# Patient Record
Sex: Male | Born: 1980 | Hispanic: No | Marital: Single | Smoking: Current every day smoker
Health system: Southern US, Community
[De-identification: ages and names within clinical notes are randomized; demographics above are authoritative.]

---

## 2005-04-23 ENCOUNTER — Emergency Department (HOSPITAL_COMMUNITY): Admission: EM | Admit: 2005-04-23 | Discharge: 2005-04-23 | Payer: Self-pay | Admitting: Family Medicine

## 2005-05-10 ENCOUNTER — Emergency Department (HOSPITAL_COMMUNITY): Admission: EM | Admit: 2005-05-10 | Discharge: 2005-05-10 | Payer: Self-pay | Admitting: Family Medicine

## 2005-11-15 ENCOUNTER — Ambulatory Visit: Payer: Self-pay | Admitting: *Deleted

## 2005-11-15 ENCOUNTER — Ambulatory Visit: Payer: Self-pay | Admitting: Internal Medicine

## 2005-11-15 DIAGNOSIS — R1013 Epigastric pain: Secondary | ICD-10-CM | POA: Insufficient documentation

## 2005-11-15 DIAGNOSIS — E049 Nontoxic goiter, unspecified: Secondary | ICD-10-CM | POA: Insufficient documentation

## 2005-12-01 ENCOUNTER — Ambulatory Visit (HOSPITAL_COMMUNITY): Admission: RE | Admit: 2005-12-01 | Discharge: 2005-12-01 | Payer: Self-pay | Admitting: Internal Medicine

## 2005-12-01 ENCOUNTER — Ambulatory Visit: Payer: Self-pay | Admitting: Internal Medicine

## 2005-12-26 ENCOUNTER — Ambulatory Visit: Payer: Self-pay | Admitting: Internal Medicine

## 2006-07-12 ENCOUNTER — Ambulatory Visit: Payer: Self-pay | Admitting: Family Medicine

## 2006-07-12 DIAGNOSIS — K219 Gastro-esophageal reflux disease without esophagitis: Secondary | ICD-10-CM

## 2006-07-12 DIAGNOSIS — F411 Generalized anxiety disorder: Secondary | ICD-10-CM | POA: Insufficient documentation

## 2006-08-01 ENCOUNTER — Ambulatory Visit: Payer: Self-pay | Admitting: Family Medicine

## 2006-11-01 ENCOUNTER — Encounter (INDEPENDENT_AMBULATORY_CARE_PROVIDER_SITE_OTHER): Payer: Self-pay | Admitting: *Deleted

## 2006-11-06 ENCOUNTER — Telehealth (INDEPENDENT_AMBULATORY_CARE_PROVIDER_SITE_OTHER): Payer: Self-pay | Admitting: *Deleted

## 2006-11-09 ENCOUNTER — Emergency Department (HOSPITAL_COMMUNITY): Admission: EM | Admit: 2006-11-09 | Discharge: 2006-11-09 | Payer: Self-pay | Admitting: Emergency Medicine

## 2006-11-09 ENCOUNTER — Emergency Department (HOSPITAL_COMMUNITY): Admission: EM | Admit: 2006-11-09 | Discharge: 2006-11-10 | Payer: Self-pay | Admitting: Emergency Medicine

## 2006-11-09 ENCOUNTER — Telehealth (INDEPENDENT_AMBULATORY_CARE_PROVIDER_SITE_OTHER): Payer: Self-pay | Admitting: Family Medicine

## 2006-11-16 ENCOUNTER — Emergency Department (HOSPITAL_COMMUNITY): Admission: EM | Admit: 2006-11-16 | Discharge: 2006-11-16 | Payer: Self-pay | Admitting: General Surgery

## 2006-12-06 ENCOUNTER — Ambulatory Visit: Payer: Self-pay | Admitting: Nurse Practitioner

## 2006-12-06 DIAGNOSIS — K5289 Other specified noninfective gastroenteritis and colitis: Secondary | ICD-10-CM

## 2007-07-03 ENCOUNTER — Encounter (INDEPENDENT_AMBULATORY_CARE_PROVIDER_SITE_OTHER): Payer: Self-pay | Admitting: *Deleted

## 2007-08-29 ENCOUNTER — Ambulatory Visit: Payer: Self-pay | Admitting: Nurse Practitioner

## 2007-08-29 DIAGNOSIS — K5900 Constipation, unspecified: Secondary | ICD-10-CM | POA: Insufficient documentation

## 2007-08-29 LAB — CONVERTED CEMR LAB
ALT: 38 units/L (ref 0–53)
AST: 20 units/L (ref 0–37)
Albumin: 4.6 g/dL (ref 3.5–5.2)
Calcium: 9.6 mg/dL (ref 8.4–10.5)
Eosinophils Absolute: 0.1 10*3/uL (ref 0.0–0.7)
Eosinophils Relative: 1 % (ref 0–5)
HCT: 49.2 % (ref 39.0–52.0)
Hemoglobin: 17.7 g/dL — ABNORMAL HIGH (ref 13.0–17.0)
Lymphocytes Relative: 34 % (ref 12–46)
Lymphs Abs: 2.1 10*3/uL (ref 0.7–4.0)
MCV: 83.2 fL (ref 78.0–100.0)
Monocytes Absolute: 0.5 10*3/uL (ref 0.1–1.0)
Monocytes Relative: 9 % (ref 3–12)
Platelets: 203 10*3/uL (ref 150–400)
RBC: 5.91 M/uL — ABNORMAL HIGH (ref 4.22–5.81)
WBC: 6.1 10*3/uL (ref 4.0–10.5)

## 2007-09-27 ENCOUNTER — Encounter (INDEPENDENT_AMBULATORY_CARE_PROVIDER_SITE_OTHER): Payer: Self-pay | Admitting: Nurse Practitioner

## 2007-11-09 ENCOUNTER — Ambulatory Visit: Payer: Self-pay | Admitting: Nurse Practitioner

## 2007-11-19 ENCOUNTER — Encounter (INDEPENDENT_AMBULATORY_CARE_PROVIDER_SITE_OTHER): Payer: Self-pay | Admitting: Nurse Practitioner

## 2007-11-28 ENCOUNTER — Ambulatory Visit (HOSPITAL_COMMUNITY): Admission: RE | Admit: 2007-11-28 | Discharge: 2007-11-28 | Payer: Self-pay

## 2007-11-28 ENCOUNTER — Encounter (INDEPENDENT_AMBULATORY_CARE_PROVIDER_SITE_OTHER): Payer: Self-pay | Admitting: Nurse Practitioner

## 2008-05-08 ENCOUNTER — Ambulatory Visit: Payer: Self-pay | Admitting: Nurse Practitioner

## 2008-08-27 ENCOUNTER — Ambulatory Visit: Payer: Self-pay | Admitting: Nurse Practitioner

## 2008-08-27 LAB — CONVERTED CEMR LAB
ALT: 29 units/L (ref 0–53)
BUN: 8 mg/dL (ref 6–23)
Basophils Relative: 1 % (ref 0–1)
CO2: 28 meq/L (ref 19–32)
Cholesterol: 187 mg/dL (ref 0–200)
Creatinine, Ser: 0.99 mg/dL (ref 0.40–1.50)
Eosinophils Absolute: 0.2 10*3/uL (ref 0.0–0.7)
HDL: 40 mg/dL (ref >39)
MCHC: 35.1 g/dL (ref 30.0–36.0)
MCV: 84.4 fL (ref 78.0–100.0)
Monocytes Relative: 10 % (ref 3–12)
Neutrophils Relative %: 51 % (ref 43–77)
Platelets: 206 10*3/uL (ref 150–400)
RBC: 5.91 M/uL — ABNORMAL HIGH (ref 4.22–5.81)
Total Bilirubin: 0.7 mg/dL (ref 0.3–1.2)
Total CHOL/HDL Ratio: 4.7
Triglycerides: 151 mg/dL — ABNORMAL HIGH (ref <150)
VLDL: 30 mg/dL (ref 0–40)

## 2008-08-28 ENCOUNTER — Encounter (INDEPENDENT_AMBULATORY_CARE_PROVIDER_SITE_OTHER): Payer: Self-pay | Admitting: Nurse Practitioner

## 2008-10-30 ENCOUNTER — Ambulatory Visit: Payer: Self-pay | Admitting: Nurse Practitioner

## 2008-10-30 DIAGNOSIS — S335XXA Sprain of ligaments of lumbar spine, initial encounter: Secondary | ICD-10-CM

## 2008-12-15 ENCOUNTER — Ambulatory Visit: Payer: Self-pay | Admitting: Nurse Practitioner

## 2009-01-27 ENCOUNTER — Encounter (INDEPENDENT_AMBULATORY_CARE_PROVIDER_SITE_OTHER): Payer: Self-pay | Admitting: Nurse Practitioner

## 2009-04-29 ENCOUNTER — Ambulatory Visit: Payer: Self-pay | Admitting: Nurse Practitioner

## 2009-09-29 ENCOUNTER — Ambulatory Visit: Payer: Self-pay | Admitting: Internal Medicine

## 2009-09-29 DIAGNOSIS — R10814 Left lower quadrant abdominal tenderness: Secondary | ICD-10-CM

## 2009-10-13 LAB — CONVERTED CEMR LAB
ALT: 34 units/L (ref 0–53)
Alkaline Phosphatase: 63 units/L (ref 39–117)
Basophils Absolute: 0.1 10*3/uL (ref 0.0–0.1)
Creatinine, Ser: 0.89 mg/dL (ref 0.40–1.50)
Eosinophils Absolute: 0.2 10*3/uL (ref 0.0–0.7)
Eosinophils Relative: 3 % (ref 0–5)
HCT: 52.2 % — ABNORMAL HIGH (ref 39.0–52.0)
MCHC: 33 g/dL (ref 30.0–36.0)
MCV: 86.4 fL (ref 78.0–100.0)
Monocytes Absolute: 0.7 10*3/uL (ref 0.1–1.0)
Platelets: 229 10*3/uL (ref 150–400)
RDW: 13.7 % (ref 11.5–15.5)
Sodium: 138 meq/L (ref 135–145)
Total Bilirubin: 0.4 mg/dL (ref 0.3–1.2)
Total Protein: 7.3 g/dL (ref 6.0–8.3)

## 2009-10-21 ENCOUNTER — Encounter (INDEPENDENT_AMBULATORY_CARE_PROVIDER_SITE_OTHER): Payer: Self-pay | Admitting: Nurse Practitioner

## 2009-10-26 ENCOUNTER — Ambulatory Visit: Payer: Self-pay | Admitting: Nurse Practitioner

## 2009-10-26 LAB — CONVERTED CEMR LAB
AST: 24 units/L (ref 0–37)
Albumin: 4.8 g/dL (ref 3.5–5.2)
Alkaline Phosphatase: 56 units/L (ref 39–117)
BUN: 14 mg/dL (ref 6–23)
Basophils Relative: 1 % (ref 0–1)
Eosinophils Absolute: 0.2 10*3/uL (ref 0.0–0.7)
Eosinophils Relative: 3 % (ref 0–5)
Glucose, Bld: 89 mg/dL (ref 70–99)
HCT: 49.3 % (ref 39.0–52.0)
MCHC: 34.1 g/dL (ref 30.0–36.0)
MCV: 85.4 fL (ref 78.0–100.0)
Monocytes Relative: 9 % (ref 3–12)
Neutrophils Relative %: 54 % (ref 43–77)
Platelets: 246 10*3/uL (ref 150–400)
Potassium: 4.4 meq/L (ref 3.5–5.3)
RBC: 5.77 M/uL (ref 4.22–5.81)
Total Bilirubin: 0.6 mg/dL (ref 0.3–1.2)

## 2009-10-27 ENCOUNTER — Encounter (INDEPENDENT_AMBULATORY_CARE_PROVIDER_SITE_OTHER): Payer: Self-pay | Admitting: Nurse Practitioner

## 2009-11-02 ENCOUNTER — Ambulatory Visit: Payer: Self-pay | Admitting: Nurse Practitioner

## 2009-11-04 ENCOUNTER — Ambulatory Visit (HOSPITAL_COMMUNITY): Admission: RE | Admit: 2009-11-04 | Discharge: 2009-11-04 | Payer: Self-pay | Admitting: Internal Medicine

## 2009-11-09 ENCOUNTER — Ambulatory Visit: Payer: Self-pay | Admitting: Internal Medicine

## 2009-12-15 ENCOUNTER — Emergency Department (HOSPITAL_COMMUNITY)
Admission: EM | Admit: 2009-12-15 | Discharge: 2009-12-15 | Payer: Self-pay | Source: Home / Self Care | Admitting: Emergency Medicine

## 2010-01-12 ENCOUNTER — Ambulatory Visit: Payer: Self-pay | Admitting: Nurse Practitioner

## 2010-01-12 ENCOUNTER — Telehealth (INDEPENDENT_AMBULATORY_CARE_PROVIDER_SITE_OTHER): Payer: Self-pay | Admitting: Nurse Practitioner

## 2010-02-04 ENCOUNTER — Encounter (INDEPENDENT_AMBULATORY_CARE_PROVIDER_SITE_OTHER): Payer: Self-pay | Admitting: *Deleted

## 2010-02-23 ENCOUNTER — Encounter (INDEPENDENT_AMBULATORY_CARE_PROVIDER_SITE_OTHER): Payer: Self-pay | Admitting: Nurse Practitioner

## 2010-03-06 ENCOUNTER — Encounter: Payer: Self-pay | Admitting: Internal Medicine

## 2010-03-09 ENCOUNTER — Ambulatory Visit
Admission: RE | Admit: 2010-03-09 | Discharge: 2010-03-09 | Payer: Self-pay | Source: Home / Self Care | Attending: Nurse Practitioner | Admitting: Nurse Practitioner

## 2010-03-09 DIAGNOSIS — M2550 Pain in unspecified joint: Secondary | ICD-10-CM | POA: Insufficient documentation

## 2010-03-16 NOTE — Assessment & Plan Note (Signed)
Summary: Constipation   Vital Signs:  Patient profile:   30 year old male Height:      72 inches Weight:      185 pounds Temp:     98.1 degrees F oral Pulse rate:   80 / minute Pulse rhythm:   regular Resp:     18 per minute BP sitting:   121 / 79  (left arm) Cuff size:   regular  Vitals Entered By: [place] Shannon (April 29, 2009 8:53 AM) CC: pt is here for stomach problem...Marland KitchenMarland Kitchen pt says the problem is pain above his bell button.... pt says  he used to have the pain in the bottom of his stomach now its on top..., Abdominal Pain Is Patient Diabetic? No Pain Assessment Patient in pain? no       Does patient need assistance? Functional Status Self care Ambulation Normal   CC:  pt is here for stomach problem...Marland KitchenMarland Kitchen pt says the problem is pain above his bell button.... pt says  he used to have the pain in the bottom of his stomach now its on top... and Abdominal Pain.  History of Present Illness:  Pt into the office with c/o stomach pain, more specifically gas. Reports that current symptoms are different that when he previously presented to this office, though it is very difficult to get history from the pt.   Pt has been seen in this office several times with stomach complaints.  Last visit 4 months ago. States that he wakes up with abdominal pain. EGD done previously which was normal.  Pt has concerns that he has cancer.  no family history of Gastric CA.    + midepigastric tenderness +gas +constipation (very small BM every other day) He has changed his diet and has stopped eating spicy foods, tomato foods, onions. No longer drinking tea or juice.  He is drinking plenty of water. Weight gain of 8 pounds since his last visit.  Pt is still using the miralax and nexium as ordered.  Dyspepsia History:      He has no alarm features of dyspepsia including no history of melena, hematochezia, dysphagia, persistent vomiting, or involuntary weight loss > 5%.  There is a prior history of  GERD.  The patient does not have a prior history of documented ulcer disease.  An H-2 blocker medication is not currently being taken.  A prior EGD has been done which showed no evidence for moderate or severe esophagitis or Barrett's esophagus.     Current Medications (verified): 1)  Tramadol Hcl 50 Mg Tabs (Tramadol Hcl) .... One Tablet By Mouth Daily As Needed For Pain 2)  Miralax   Powd (Polyethylene Glycol 3350) .Marland Kitchen.. 1 Tablespoon Mixed With 8oz Glass of Water or Juice 3)  Hydrocortisone 1 %  Crea (Hydrocortisone) .Marland Kitchen.. 1 Application To Neck Daily As Needed 4)  Nexium 40 Mg Cpdr (Esomeprazole Magnesium) .Marland Kitchen.. 1 Tablet By Mouth Daily For Stomach  Allergies (verified): No Known Drug Allergies  Review of Systems General:  Denies fever. CV:  Denies chest pain or discomfort. Resp:  Denies cough. GI:  Complains of abdominal pain and constipation; denies indigestion, nausea, and vomiting.  Physical Exam  General:  alert.   Head:  normocephalic.   Lungs:  normal breath sounds.   Heart:  normal rate and regular rhythm.   Abdomen:  BS x 4 LLQ tenderness most tender to palpation with some tenderness in LUQ and RUQ Msk:  up to the exam table Neurologic:  alert &  oriented X3.     Impression & Recommendations:  Problem # 1:  CONSTIPATION (ICD-564.00) advised pt to continue taking miralax daily Needs to add more fiber foods to his diet - reviewed with pt what foods he should eat His updated medication list for this problem includes:    Miralax Powd (Polyethylene glycol 3350) .Marland Kitchen... 1 tablespoon mixed with 8oz glass of water or juice  Problem # 2:  GERD (ICD-530.81) continue current meds His updated medication list for this problem includes:    Nexium 40 Mg Cpdr (Esomeprazole magnesium) .Marland Kitchen... 1 tablet by mouth daily for stomach  Complete Medication List: 1)  Tramadol Hcl 50 Mg Tabs (Tramadol hcl) .... One tablet by mouth daily as needed for pain 2)  Miralax Powd (Polyethylene glycol  3350) .Marland Kitchen.. 1 tablespoon mixed with 8oz glass of water or juice 3)  Hydrocortisone 1 % Crea (Hydrocortisone) .Marland Kitchen.. 1 application to neck daily as needed 4)  Nexium 40 Mg Cpdr (Esomeprazole magnesium) .Marland Kitchen.. 1 tablet by mouth daily for stomach  Dyspepsia Assessment/Plan:  Step Therapy: GERD Treatment Protocols:    Step-1: started  Patient Instructions: 1)  You have constipation.   2)  You need to keep taking the miralax daily. 3)  Add more foods with fiber to your diet. 4)  Get magnesium sulfate from pharmacy - drink 1/2 bottle tonight when you get home then drink the other 1/2 of the bottle tomorrow 5)  Continue to take your nexium (purple pill) in the morning. 6)  Follow up as needed

## 2010-03-16 NOTE — Letter (Signed)
Summary: *HSN Results Follow up  [facility]  [address] [place], [place] [address]   Phone: [phone]  Fax: [phone]      10/27/2009   RAYFIELD BEEM [address] [place], [place]  [address]   Dear  Mr. LATRON RIBAS,                            ____S.Drinkard,FNP   ____D. Gore,FNP       ____B. McPherson,MD   ____V. Rankins,MD    ____E. Mulberry,MD    _X__N. Daphine Deutscher, FNP  ____D. Reche Dixon, MD    ____K. Philipp Deputy, MD    ____Other     This letter is to inform you that your recent test(s):  _______Pap Smear    ___X____Lab Test     _______X-ray    ___X___ is within acceptable limits  _______ requires a medication change  _______ requires a follow-up lab visit  _______ requires a follow-up visit with your provider   Comments: Labs done during recent office visit are normal.       _________________________________________________________ If you have any questions, please contact our office [phone].                    Sincerely,    Lehman Prom FNP [facility]

## 2010-03-16 NOTE — Assessment & Plan Note (Signed)
Summary: MARTIN FNP/ STOMACH PROBLEMS//GK   Vital Signs:  Patient profile:   30 year old male Weight:      181.8 pounds BMI:     24.75 Temp:     97.5 degrees F Pulse rate:   69 / minute Pulse rhythm:   regular Resp:     18 per minute BP sitting:   120 / 80  (left arm) Cuff size:   regular  Vitals Entered By: Vesta Mixer CMA (September 29, 2009 11:24 AM) CC: Recurrent stomach pain.  Not taking meds because his card expired. Is Patient Diabetic? No  Does patient need assistance? Ambulation Normal   CC:  Recurrent stomach pain.  Not taking meds because his card expired.Marland Kitchen  History of Present Illness: 1.  Abdominal pain:  Hx of chronic complaints.  Used to have bilateral lower abdominal pain.  States he went to GI specialist  for this and underwent EGD  in 11/2007 with Dr. Corinda Gubler that was normal.  Pt. states the lower abdominal pain resolved eventually and then developed epigastric pain.  Looking through his chart, it appears he has been on at least a couple of antispasmodics--Levsin and Bentyl.  Pt. states these have not helped. Pt. also describes constipation on a regular basis.  Cannot clarify if Miralax helps.  Sometimes, however, can have explosive loose stool--maybe once a week.  Most of time dealing with bloating and constipation.  Generally feels better when has to fast for religious holidays, but then when eats, feels he needs to rush to the bathroom only to be unable to pass stool.  No melena or hematochezia.   Describes having something hot coming up his chest and into his throat. No family history of colon cancer.   [place]  States his symptoms really seemed to start in 2007 when he started eating fast foods.  Pt. states he is no longer eating that way. Pt.  later gives history of a friend of his who had similar problems with stomach pain and ultimately diagnosed with gastric cancer and died--previous pt. here.  Discussed he already had a normal EGD and that  his symptoms of epigastric pain preceded the study.  Allergies (verified): No Known Drug Allergies  Family History: Father, 60:  Healthy Mother, 28s:  Healthy 4 Brothers, 2 Sisters:  all younger and all healthy No children   Social History: Lives at home with 2 roommates. 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 Moved to [place] in [place] from [place].   Came as a refugee. Bettina Gavia is in [place].    Physical Exam  General:  Anxious appearing male, NAD otherwise Lungs:  Normal respiratory effort, chest expands symmetrically. Lungs are clear to auscultation, no crackles or wheezes. Heart:  Normal rate and regular rhythm. S1 and S2 normal without gallop, murmur, click, rub or other extra sounds. Abdomen:  Soft, mild LLQ tenderness without peritoneal or rebound.  LLQ with what feels like palpable stool.  No HSM or other  mass. Rectal:  No external abnormalities noted. Normal sphincter tone. No rectal masses or tenderness.  Heme negative brown stool.   Impression & Recommendations:  Problem # 1:  ABDOMINAL TENDERNESS, LEFT LOWER QUADRANT (ICD-789.64) Suspect secondary to constipation Orders: T-Comprehensive Metabolic Panel (04540-98119) T-CBC w/Diff ([id number])  Problem # 2:  CONSTIPATION (ICD-564.00) Needs to follow N. Martin's directions regarding Miralax and high fiber diet. 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 1 tablet by mouth daily for stomach  Orders: T-Comprehensive Metabolic Panel ([id number]) T-CBC w/Diff ([id number])  Problem # 4:  ANXIETY (ICD-300.00) Offered counseling  with [facility] for treatment.   Pt. not interested.  Complete Medication List: 1)  Tramadol Hcl 50 Mg Tabs (Tramadol hcl) .... One tablet by mouth daily as needed for pain 2)  Miralax Powd (Polyethylene glycol 3350) .Marland KitchenMarland KitchenMarland Kitchen 17 g powder mixed with 8 oz water 3)  Hydrocortisone 1 % Crea (Hydrocortisone) .Marland Kitchen.. 1 application to neck daily as needed 4)  Nexium 40 Mg Cpdr (Esomeprazole magnesium) .Marland Kitchen.. 1 tablet by mouth daily for stomach  Patient Instructions: 1)  You need to take your meds regularly--do not stop them unless you are having a side effect. 2)  You need to follow up in 1 month to make sure the tenderness in your left abdomen is gone when you have normal soft bowel movements. 3)  Eat lots of fruits and vegetables. 4)  Bring in stool cards in 2 weeks completed 5)  Follow up with Jesse Fall in 1 month to recheck Left lower abdomen and make sure nontender and soft. Prescriptions: NEXIUM 40 MG CPDR (ESOMEPRAZOLE MAGNESIUM) 1 tablet by mouth daily for stomach  #30 x 6   Entered and Authorized by:   Julieanne Manson MD   Signed by:   Julieanne Manson MD on 09/29/2009   Method used:   Faxed to ...       [facility] (retail)       [address] [place], [place]  [address]       Ph: [phone]       Fax: [phone]   RxID:   [id number] MIRALAX   POWD (POLYETHYLENE GLYCOL 3350) 17 g powder mixed with 8 oz water  #1 month x 11   Entered and Authorized by:   Julieanne Manson MD   Signed by:   Julieanne Manson MD on 09/29/2009   Method used:   Faxed to ...       [facility] (retail)       [address] [place], [place]  [address]       Ph: [phone]       Fax: [phone]   RxID:   [id number]   Appended Document: Daphine Deutscher FNP/ STOMACH PROBLEMS//GK  Laboratory Results    Stool - Occult Blood Hemmoccult #1: negative Date: 10/21/2009 Hemoccult #2: negative Date:  10/21/2009 Hemoccult #3: negative Date: 10/21/2009

## 2010-03-16 NOTE — Assessment & Plan Note (Signed)
Summary: F/u U/S results   Vital Signs:  Patient profile:   30 year old male Weight:      181.0 pounds Temp:     97.4 degrees F oral Pulse rate:   68 / minute Pulse rhythm:   regular Resp:     20 per minute BP sitting:   128 / 80  (left arm) Cuff size:   regular  Vitals Entered By: Levon Hedger (November 09, 2009 11:21 AM) CC: follow-up visit u/s results...still having the same issue, Abdominal Pain Is Patient Diabetic? No Pain Assessment Patient in pain? no       Does patient need assistance? Functional Status Self care Ambulation Normal Comments pt brought medication today   CC:  follow-up visit u/s results...still having the same issue and Abdominal Pain.  History of Present Illness:  Pt into the office for ultrasound results.  Friend n room with pt today He presents today with all his medications. (nexium and miralax) Pt seems confused by the fact that he taking nexium as dispensed by the [place] pharmacy and by the nexium that [facility] PAP program. Pt still has questions about his diet and if he still need to avoid the foods previously discussed such as onions, tomatos, fried foods, ETOH.  Dyspepsia History:      There is a prior history of GERD.  The patient does not have a prior history of documented ulcer disease.  The dominant symptom is heartburn or acid reflux.  An H-2 blocker medication is not currently being taken.  A prior EGD has been done which showed no evidence for moderate or severe esophagitis or Barrett's esophagus.     Habits & Providers  Alcohol-Tobacco-Diet     Alcohol drinks/day: 0     Tobacco Status: never  Exercise-Depression-Behavior     Does Patient Exercise: yes     Exercise Counseling: to improve exercise regimen     Type of exercise: soccer     Times/week: 1  Comments: SOB when doing most anything physical such as climbing steps.  no exercise other than soccer on sundays.  No SOB with Normal activities such as walking.   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 CV:  Denies chest pain or discomfort. Resp:  Complains of shortness of breath; denies cough; Usually when climbing the steps to deliver, also during the soccer games.  He used to be very athletic but now only plays soccer on Sundays.Marland Kitchen GI:  Complains of indigestion; denies constipation. Neuro:  Complains of headaches and tingling; left hand tingling. Psych:  Complains of anxiety; friend present with today admits that pt is anxious.  Physical Exam  General:  alert.   Head:  normocephalic.   Lungs:  normal breath sounds.   Heart:  normal rate and regular rhythm.   Msk:  up to the exam table Neurologic:  alert & oriented X3.   Psych:  Oriented X3.     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 symptoms are controlled now but that does not ok him to return back to his normal diet His updated medication list for this problem includes:  Nexium 40 Mg Cpdr (Esomeprazole magnesium) .Marland Kitchen... 1 tablet by mouth daily for stomach  Complete Medication List: 1)  Tramadol Hcl 50 Mg Tabs (Tramadol hcl) .... One tablet by mouth daily as needed for pain 2)  Miralax Powd (Polyethylene glycol 3350) .Marland KitchenMarland KitchenMarland Kitchen 17 g powder mixed with 8 oz water 3)  Hydrocortisone 1 % Crea (Hydrocortisone) .Marland Kitchen.. 1 application to neck daily as needed 4)  Nexium 40 Mg Cpdr (Esomeprazole magnesium) .Marland Kitchen.. 1 tablet by mouth daily for stomach  Dyspepsia Assessment/Plan:  Step Therapy: GERD Treatment Protocols:    Step-1: started  Patient Instructions: 1)  Follow up as needed.

## 2010-03-16 NOTE — Letter (Signed)
Summary: TEST ORDER FORM//ULTRASOUND /APPT DATE & TIME  TEST ORDER FORM//ULTRASOUND /APPT DATE & TIME   Imported By: Arta Bruce 11/06/2009 15:10:16  _____________________________________________________________________  External Attachment:    Type:   Image     Comment:   External Document

## 2010-03-16 NOTE — Letter (Signed)
Summary: Handout Printed  Printed Handout:  - Constipation-Brief 

## 2010-03-16 NOTE — Progress Notes (Signed)
Summary: GI referral  Phone Note Outgoing Call   Summary of Call: refer to GI reason: ongoing stomach issues Referral in your basket Initial call taken by: Lehman Prom FNP,  January 12, 2010 9:28 AM  Follow-up for Phone Call        SEND REFERRALL TO [facility] WAITING FOR AN APPT  Follow-up by: Cheryll Dessert,  January 12, 2010 10:07 AM

## 2010-03-16 NOTE — Assessment & Plan Note (Signed)
Summary: Thyromegly   Vital Signs:  Patient profile:   30 year old male Weight:      178.1 pounds Temp:     97.5 degrees F oral Pulse rate:   80 / minute Pulse rhythm:   regular Resp:     20 per minute BP sitting:   130 / 100  (left arm) Cuff size:   regular  Vitals Entered By: Levon Hedger (November 02, 2009 11:25 AM) CC: 1 month f/u...redness in eyes, Abdominal Pain Is Patient Diabetic? No Pain Assessment Patient in pain? no       Does patient need assistance? Functional Status Self care Ambulation Normal Comments pt states he is not taking medication he says it gives him alot of problem   CC:  1 month f/u...redness in eyes and Abdominal Pain.  History of Present Illness:  Pt into the office with f/u on abdominal pain. Pt reports that he is taking miralax daily as ordered. Pt has also started taking nexium 40mg  by mouth 1 hour before food.  Pt is taking nexium and is tolerating that well.  He reports that he was also to take a medication daily before breakfast but he had some side effects.  Pt does not know the name of the medications.  Reports it was started after this last visit and so he stopped that medication.  Abdominal symptoms have seemed to improve at time  PT DID NOT BRING HIS MEDICATIONS TODAY IN THE OFFICE - Pt has been advised to bring all the medications to each visit  Dyspepsia History:      He has no alarm features of dyspepsia including no history of melena, hematochezia, dysphagia, persistent vomiting, or involuntary weight loss > 5%.  There is a prior history of GERD.  The patient does not have a prior history of documented ulcer disease.  The dominant symptom is heartburn or acid reflux.  An H-2 blocker medication is not currently being taken.  A prior EGD has been done which showed no evidence for moderate or severe esophagitis or Barrett's esophagus.     Habits & Providers  Alcohol-Tobacco-Diet     Alcohol drinks/day: 0     Tobacco Status:  never  Exercise-Depression-Behavior     Does Patient Exercise: yes     Exercise Counseling: to improve exercise regimen     Type of exercise: soccer     Times/week: 1  Allergies (verified): No Known Drug Allergies  Review of Systems General:  Complains of fatigue. Eyes:  Complains of eye irritation and red eye. CV:  Complains of fatigue and palpitations. Resp:  Denies cough. GI:  Complains of abdominal pain; denies nausea and vomiting; ongoing abd pain. Neuro:  Complains of numbness and tingling; Left hand numbness.  Physical Exam  General:  alert.   Head:  normocephalic.   Eyes:  ? exopthalmus scerla red Neck:  right sided thyroid enlargement Lungs:  normal breath sounds.   Heart:  normal rate and regular rhythm.   Abdomen:  normal bowel sounds.   no abdominal tenderness  Msk:  up to the exam table Neurologic:  alert & oriented X3.     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 1 tablet by  mouth daily for stomach  Complete Medication List: 1)  Tramadol Hcl 50 Mg Tabs (Tramadol hcl) .... One tablet by mouth daily as needed for pain 2)  Miralax Powd (Polyethylene glycol 3350) .Marland KitchenMarland KitchenMarland Kitchen 17 g powder mixed with 8 oz water 3)  Hydrocortisone 1 % Crea (Hydrocortisone) .Marland Kitchen.. 1 application to neck daily as needed 4)  Nexium 40 Mg Cpdr (Esomeprazole magnesium) .Marland Kitchen.. 1 tablet by mouth daily for stomach  Dyspepsia Assessment/Plan:  Step Therapy: GERD Treatment Protocols:    Step-1: started  Patient Instructions: 1)  Get thyroid ultrasound as ordered. 2)  Thyroid problems may be the cause of fatigue, dizziness, eye problems that you are experiencing. 3)  Follow up  in this office 1 week after the ultrasound for results and thyroid lab results. 4)  Keep taking your nexium and miralax (powder medication) as ordered

## 2010-03-16 NOTE — Letter (Signed)
Summary: *HSN Results Follow up  [facility]  [address] [place], [place] [address]   Phone: [phone]  Fax: [phone]      10/21/2009   Alexander Soto [address] [place], [place]  [address]   Dear  [name],                            ____S.Drinkard,FNP   ____D. Gore,FNP       ____B. McPherson,MD   ____V. Rankins,MD    ____E. Mulberry,MD    _X___N. Daphine Deutscher, FNP  ____D. Reche Dixon, MD    ____K. Philipp Deputy, MD    ____Other     This letter is to inform you that your recent test(s):  Stool Cards    ___X____ is within acceptable limits  _______ requires a medication change  _______ requires a follow-up lab visit  _______ requires a follow-up visit with your provider   Comments: Stool cards x 3 normal.       _________________________________________________________ If you have any questions, please contact our office [phone].                    Sincerely,    Lehman Prom FNP [facility]

## 2010-03-16 NOTE — Assessment & Plan Note (Signed)
Summary: Abdominal problems   Vital Signs:  Patient profile:   30 year old male Height:      72 inches Weight:      180 pounds Temp:     97.8 degrees F oral Pulse rate:   72 / minute Pulse rhythm:   regular Resp:     18 per minute BP sitting:   100 / 78  (left arm) Cuff size:   regular  Vitals Entered By: Armenia Shannon (January 12, 2010 8:56 AM) CC: stomach pains.., Abdominal Pain Is Patient Diabetic? No Pain Assessment Patient in pain? no       Does patient need assistance? Functional Status Self care Ambulation Normal   CC:  stomach pains.Marland Kitchen and Abdominal Pain.  History of Present Illness:  Pt into the office with continued complaints of abdominal pain. Pt has been into this office several times for multiple complaints. Pt has has had workup for both cardiac and GI symptoms which were all normal. Pt reports that he has been to the Urgent Care since his last visit here. "My heart is good"  Pt reports that he had an x-ray done Symptoms are present mostly in the morning Also when he eats different foods - this provider has been over and over with pt regarding the foods to avoid.  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  I have advised not to eat food from that office  Dyspepsia History:      The patient has positive alarm features of dyspepsia which include history of anemia.  There is a prior history of GERD.  The patient does not have a prior history of documented ulcer disease.  The dominant symptom is heartburn or acid reflux.  An H-2 blocker medication is not currently being taken.  He has no history of a positive H. Pylori serology.  A prior EGD has been done which showed no evidence for moderate or severe esophagitis or Barrett's esophagus.     Allergies: No Known Drug Allergies  Review of Systems General:  Denies fever. CV:  Denies chest pain or discomfort. Resp:  Denies cough. GI:  Complains of abdominal pain and gas; denies  constipation, nausea, and vomiting; +intermittent.  Physical Exam  General:  alert.   Head:  normocephalic.   Lungs:  normal breath sounds.   Heart:  normal rate and regular rhythm.   Abdomen:  normal bowel sounds.   Msk:  up to the exam table Neurologic:  alert & oriented X3.   Skin:  color normal.   Psych:  Oriented X3.     Impression & Recommendations:  Problem # 1:  GERD (ICD-530.81) ongoing problem Pt reports that he is taking nexium as ordered he has also been advised to monitor his diet His updated medication list for this problem includes:    Nexium 40 Mg Cpdr (Esomeprazole magnesium) .Marland Kitchen... 1 tablet by mouth daily for stomach  Orders: Gastroenterology Referral (GI)  Problem # 2:  NEED PROPHYLACTIC VACCINATION&INOCULATION FLU (ICD-V04.81) given today in office  Problem # 3:  ANXIETY (ICD-300.00) spoke with pt advised him that mood as some to do with how his body feels.   If he is overly anxious then he needs to start meds, pt has been reluctant in the past  Complete Medication List: 1)  Tramadol Hcl 50 Mg Tabs (Tramadol hcl) .... One tablet by mouth daily as needed for pain 2)  Miralax Powd (Polyethylene glycol 3350) .Marland KitchenMarland KitchenMarland Kitchen 17 g powder mixed  with 8 oz water 3)  Hydrocortisone 1 % Crea (Hydrocortisone) .Marland Kitchen.. 1 application to neck daily as needed 4)  Nexium 40 Mg Cpdr (Esomeprazole magnesium) .Marland Kitchen.. 1 tablet by mouth daily for stomach  Other Orders: Flu Vaccine 41yrs + (31540) Admin 1st Vaccine (08676)  Dyspepsia Assessment/Plan:  Step Therapy: GERD Treatment Protocols:    Step-1: started  Patient Instructions: 1)  You  have been given the flu vaccine today 2)  You will be referred to the Stomach specialist and you will be notified of the time/date of the appointment. 3)  Follow up in this office as needed   Orders Added: 1)  Flu Vaccine 45yrs + [90658] 2)  Admin 1st Vaccine [90471] 3)  Est. Patient Level III [19509] 4)  Gastroenterology Referral  [GI]   Immunizations Administered:  Influenza Vaccine # 1:    Vaccine Type: Fluvax 3+    Site: right deltoid    Mfr: GlaxoSmithKline    Dose: 0.5 ml    Route: IM    Given by: Armenia Shannon    Exp. Date: 08/14/2010    Lot #: [id number]    VIS given: 09/08/09 version given January 12, 2010.  Flu Vaccine Consent Questions:    Do you have a history of severe allergic reactions to this vaccine? no    Any prior history of allergic reactions to egg and/or gelatin? no    Do you have a sensitivity to the preservative Thimersol? no    Do you have a past history of Guillan-Barre Syndrome? no    Do you currently have an acute febrile illness? no    Have you ever had a severe reaction to latex? no    Vaccine information given and explained to patient? yes   Immunizations Administered:  Influenza Vaccine # 1:    Vaccine Type: Fluvax 3+    Site: right deltoid    Mfr: GlaxoSmithKline    Dose: 0.5 ml    Route: IM    Given by: Armenia Shannon    Exp. Date: 08/14/2010    Lot #: [id number]    VIS given: 09/08/09 version given January 12, 2010.

## 2010-03-18 NOTE — Letter (Signed)
Summary: *HSN Results Follow up  [facility]  [address] [place], [place] [address]   Phone: [phone]  Fax: [phone]      02/04/2010   Alexander Soto Facer [address] [place], [place]  [address]   Dear  Mr. [name], WE HAVE BEEN TRYING TO REACH YOU ABOUT YOUR GI REFERRAL PLEASE, CALL  GI PH # [phone] TO SCHEDULE AN APPT AND THE ADDRESS IS  [address] . THANK YOU FOR YOU ATTENTION AND HAVE A HAPPY & SAFETY HOLIDAYS   If you have any questions, please contact our office                     Sincerely,  Cheryll Dessert [facility]

## 2010-03-18 NOTE — Letter (Signed)
Summary: Referral - not able to see patient  [facility]  [address] [place], [place] [address]   Phone: [phone]  Fax: [phone]    February 23, 2010   Lehman Prom, FNP [address] [place], [place] [address]   Re:   Alexander Soto DOB:  11-Feb-1981 MRN:   [id number]    Dear Dr. Daphine Deutscher:  Thank you for your kind referral of the above patient.  We have attempted to schedule the recommended procedure Consultation regarding abdominal pain, but have not been able to schedule because:  ___ The patient was not available by phone and/or has not returned our calls.  X  The patient declined to schedule the procedure at this time.  We appreciate the referral and hope that we will have the opportunity to treat this patient in the future.    Sincerely,    [facility] [phone]

## 2010-03-18 NOTE — Assessment & Plan Note (Signed)
Summary: Joint Pain   Vital Signs:  Patient profile:   30 year old male Weight:      179.1 pounds BMI:     24.38 Temp:     97.8 degrees F oral Pulse rate:   68 / minute Pulse rhythm:   regular Resp:     16 per minute BP sitting:   130 / 90  (left arm) Cuff size:   regular  Vitals Entered By: Levon Hedger (March 09, 2010 12:15 PM) CC: pt is having alot of joint pain in knees, elbow, fingers and hands.... has been feeling very dizzy...Marland Kitchenhas had a cold but just not feeling well, Abdominal Pain Is Patient Diabetic? No Pain Assessment Patient in pain? yes       Does patient need assistance? Functional Status Self care Ambulation Normal   CC:  pt is having alot of joint pain in knees, elbow, fingers and hands.... has been feeling very dizzy...Marland Kitchenhas had a cold but just not feeling well, and Abdominal Pain.  History of Present Illness:  Pt into the office with c/o pain in elbows and joint pain. Pain in multiple joints - alternates between left and right elbows. Also with some c/o pain in wrist and fingers Bilateral knees are also affected. Started 1-2 months ago.  Social - pt is playing soccer but last played 1 month ago. He does have a compter He is employed at the [employer].  Social - pt is traveling to [place] for 3 months on next week. Pt is gettng married.  Dyspepsia History:      He has no alarm features of dyspepsia including no history of melena, hematochezia, dysphagia, persistent vomiting, or involuntary weight loss > 5%.  There is a prior history of GERD.  The patient does not have a prior history of documented ulcer disease.  The dominant symptom is heartburn or acid reflux.  An H-2 blocker medication is not currently being taken.  A prior EGD has been done which showed no evidence for moderate or severe esophagitis or Barrett's esophagus.     Habits & Providers  Alcohol-Tobacco-Diet     Alcohol drinks/day: 0     Tobacco Status:  never  Exercise-Depression-Behavior     Does Patient Exercise: yes     Exercise Counseling: to improve exercise regimen     Type of exercise: soccer     Times/week: 1     Have you felt down or hopeless? no     Have you felt little pleasure in things? no     Depression Counseling: not indicated; screening negative for depression     Drug Use: never  Current Medications (verified): 1)  Tramadol Hcl 50 Mg Tabs (Tramadol Hcl) .... One Tablet By Mouth Daily As Needed For Pain 2)  Miralax   Powd (Polyethylene Glycol 3350) .Marland KitchenMarland KitchenMarland Kitchen 17 G Powder Mixed With 8 Oz Water 3)  Hydrocortisone 1 %  Crea (Hydrocortisone) .Marland Kitchen.. 1 Application To Neck Daily As Needed 4)  Nexium 40 Mg Cpdr (Esomeprazole Magnesium) .Marland Kitchen.. 1 Tablet By Mouth Daily For Stomach  Allergies (verified): No Known Drug Allergies  Social History: Drug Use:  never  Review of Systems MS:  Complains of joint pain; elbows, wrist.  Physical Exam  General:  alert.   Head:  normocephalic.   Abdomen:  normal bowel sounds.   Msk:  normal ROM.   no inflammed joints Neurologic:  alert & oriented X3.   Skin:  markings on hands Psych:  Oriented X3.  Impression & Recommendations:  Problem # 1:  PAIN IN JOINT, MULTIPLE SITES (ICD-719.49) reviewed with pt this is likely environmental may be due to sleeping pattern. advised pt to lay on his back versus with both arms under him  Problem # 2:  GERD (ICD-530.81) advised pt that is his diet is likelly different in [place] he can take meds with him but may not need to take if he does not have problems His updated medication list for this problem includes:    Nexium 40 Mg Cpdr (Esomeprazole magnesium) .Marland Kitchen... 1 tablet by mouth daily for stomach  Complete Medication List: 1)  Tramadol Hcl 50 Mg Tabs (Tramadol hcl) .... One tablet by mouth daily as needed for pain 2)  Miralax Powd (Polyethylene glycol 3350) .Marland KitchenMarland KitchenMarland Kitchen 17 g powder mixed with 8 oz water 3)  Hydrocortisone 1 % Crea (Hydrocortisone) .Marland Kitchen..  1 application to neck daily as needed 4)  Nexium 40 Mg Cpdr (Esomeprazole magnesium) .Marland Kitchen.. 1 tablet by mouth daily for stomach  Dyspepsia Assessment/Plan:  Step Therapy: GERD Treatment Protocols:    Step-1: started  Patient Instructions: 1)  Joint pain - most likely due to positions at sleep with your arms.  You should also avoid popping your knuckles. 2)  You may take Tylenol Extra Strength for the pain in the joints. 3)  Follow up as needed   Orders Added: 1)  Est. Patient Level III [04540]

## 2010-11-25 LAB — POCT URINALYSIS DIP (DEVICE)
Glucose, UA: NEGATIVE
Ketones, ur: NEGATIVE
Operator id: 239701
Specific Gravity, Urine: >=1.03
Urobilinogen, UA: 0.2

## 2010-11-25 LAB — URINE CULTURE
Colony Count: NO GROWTH
Culture: NO GROWTH

## 2010-11-25 LAB — POCT RAPID STREP A: Streptococcus, Group A Screen (Direct): NEGATIVE

## 2012-02-03 IMAGING — US US SOFT TISSUE HEAD/NECK
1 series · 14 of 25 positions shown · non-contrast
Comparison: 03/26/1805

CLINICAL DATA: Thyromegaly

THYROID ULTRASOUND
TECHNIQUE: Ultrasound examination of the thyroid gland and
adjacent soft tissues was performed.

[Series 1: us soft tissue head/neck · 0.07mm/px · 14 of 38 slices shown]
[im 1/38]
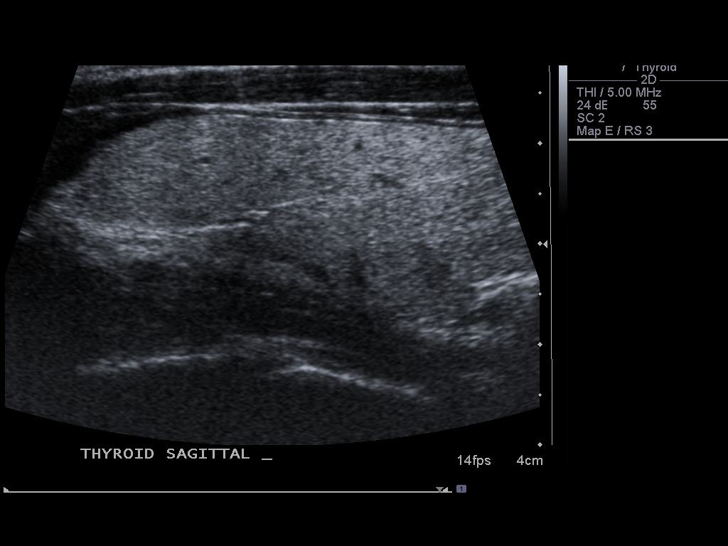
[im 4/38]
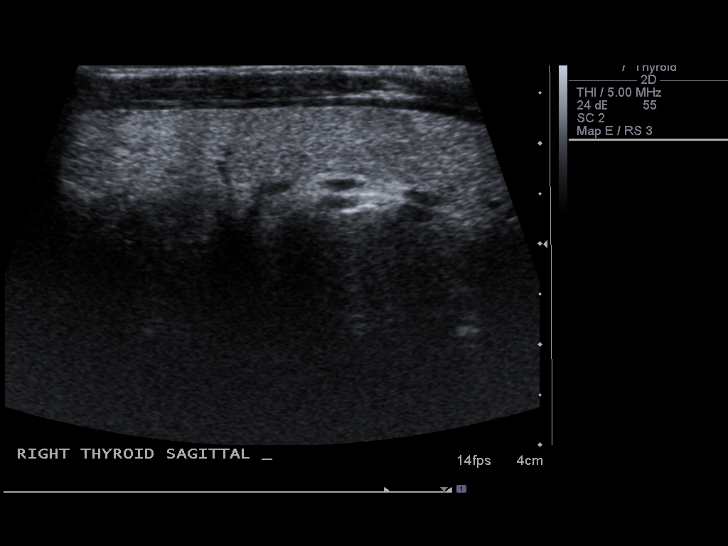
[im 7/38]
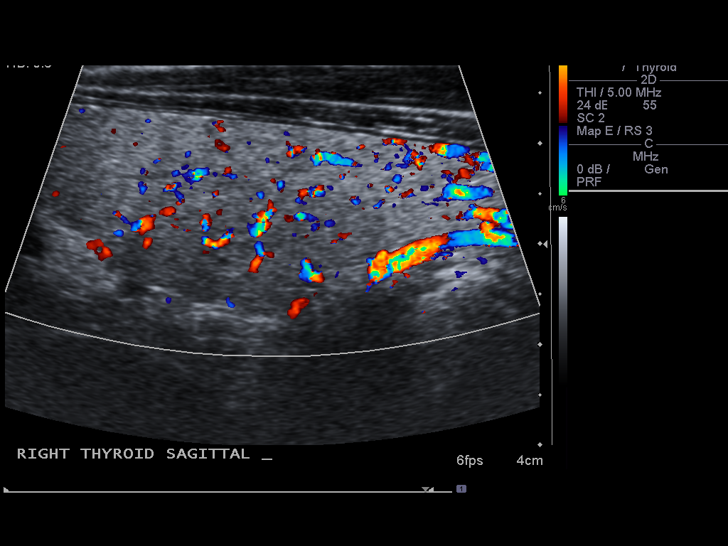
[im 10/38]
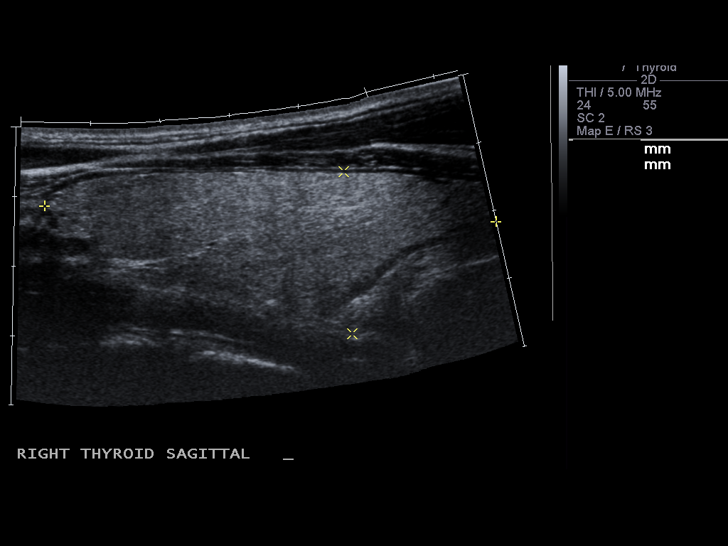
[im 13/38]
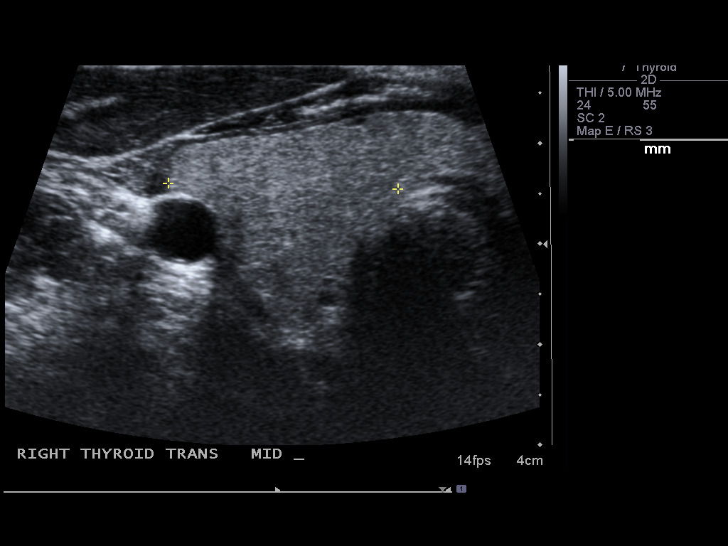
[im 14/38]
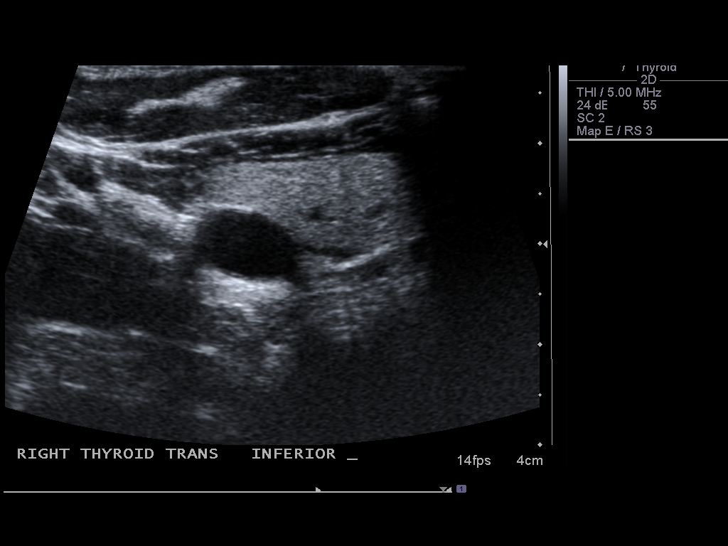
[im 17/38]
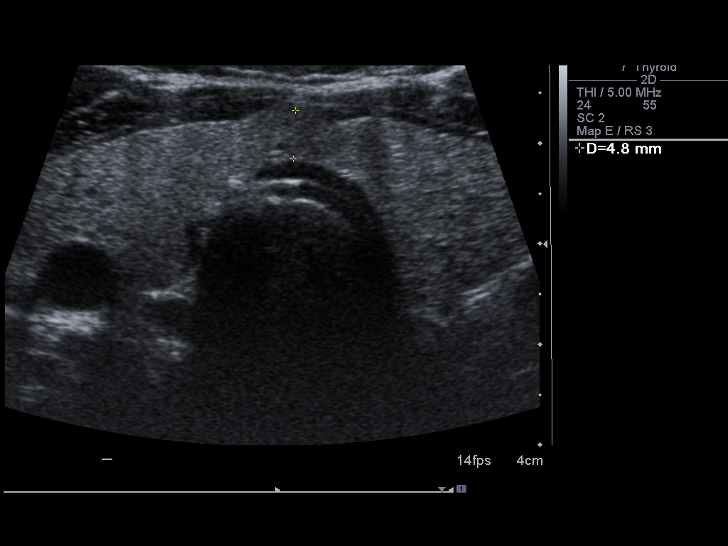
[im 21/38]
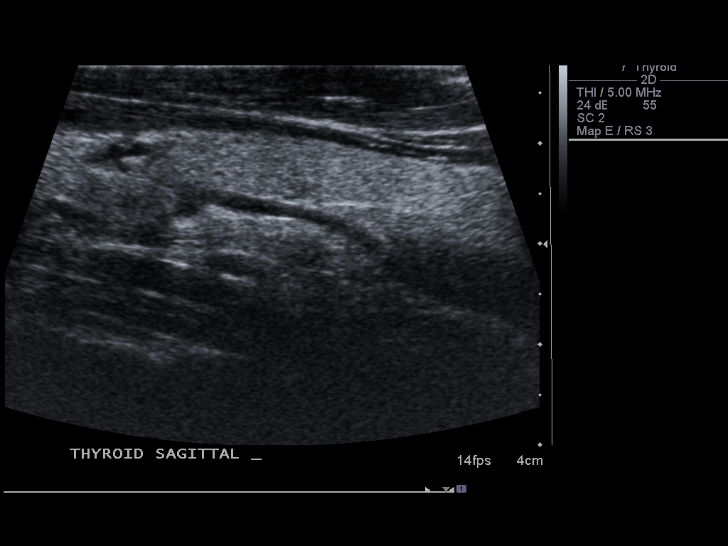
[im 24/38]
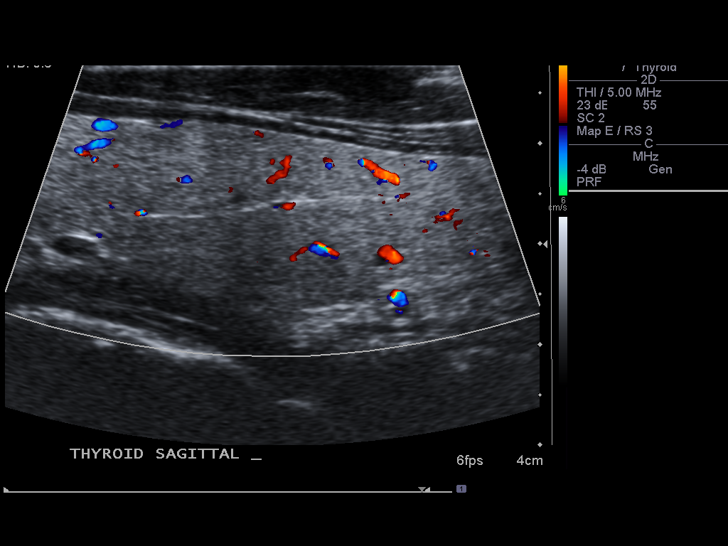
[im 25/38]
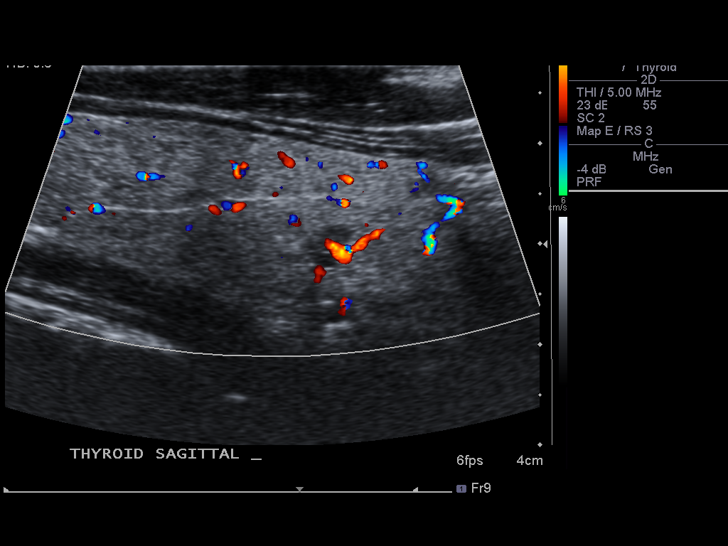
[im 28/38]
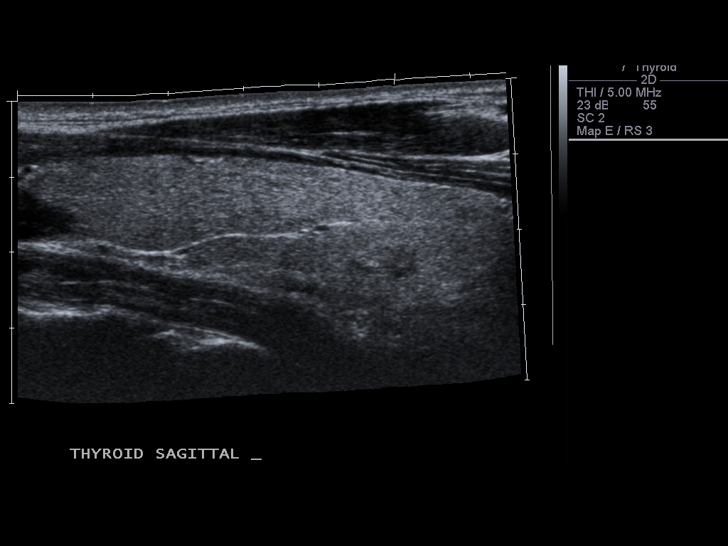
[im 31/38]
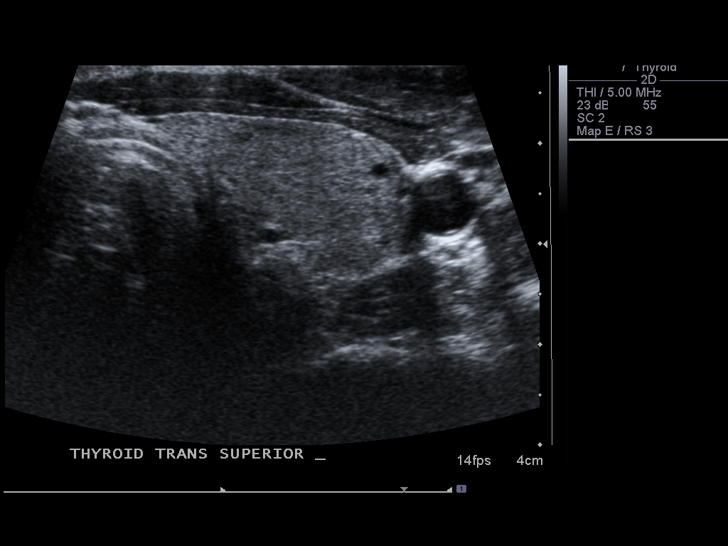
[im 34/38]
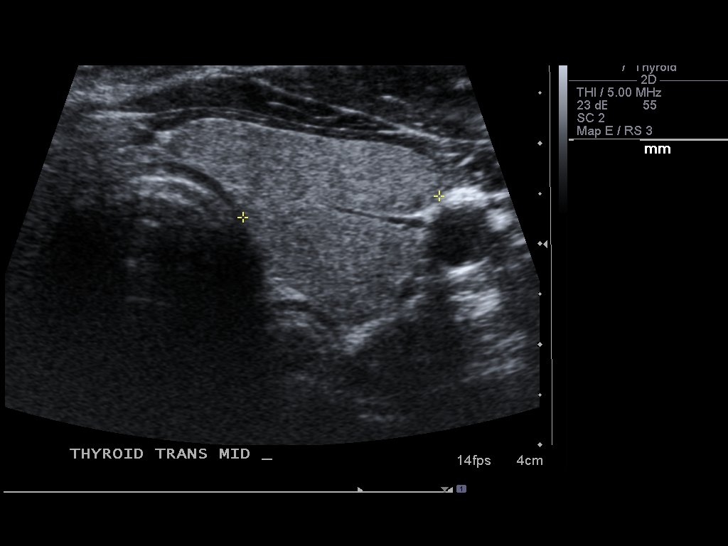
[im 38/38]
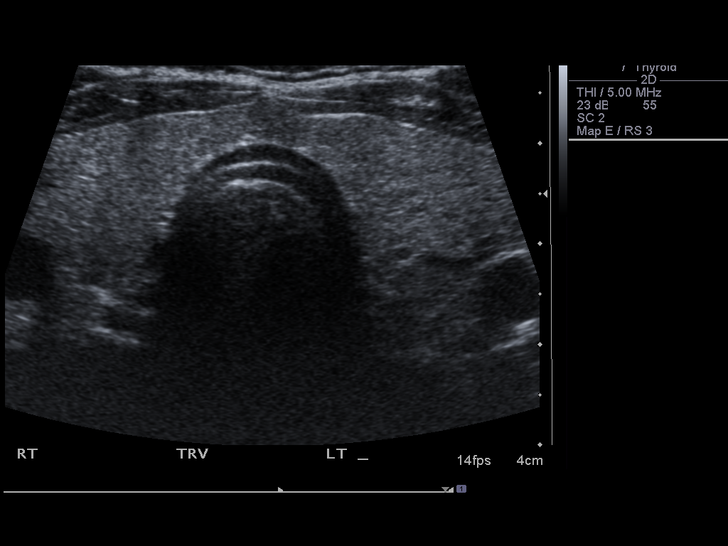

[14 of 25 positions shown; findings below may reference images not displayed]

FINDINGS: The right thyroid lobe measures 6.5 x 2.3 x 2.3 cm.  The left lobe
measures 6.6 x 1.8 x 2.0 cm.  The isthmus is 0.5 mm in thickness.

4 x 7 mm hypoechoic nodule in the isthmus is essentially unchanged
in the 4 years since the prior study.
IMPRESSION: Stable mild enlargement of the thyroid gland.

Stable tiny nodule in the isthmus with two very tiny nodules now
seen in the left thyroid lobe.

## 2019-05-13 ENCOUNTER — Ambulatory Visit: Payer: Self-pay | Attending: Internal Medicine

## 2019-05-13 DIAGNOSIS — Z23 Encounter for immunization: Secondary | ICD-10-CM

## 2019-05-13 NOTE — Progress Notes (Signed)
   Covid-19 Vaccination Clinic  Name:  Richards Pherigo    MRN: [id number] DOB: 08/11/1980  05/13/2019  Mr. Delle Reining was observed post Covid-19 immunization for 15 minutes without incident. He was provided with Vaccine Information Sheet and instruction to access the V-Safe system.   Mr. Delle Reining was instructed to call 911 with any severe reactions post vaccine: Marland Kitchen Difficulty breathing  . Swelling of face and throat  . A fast heartbeat  . A bad rash all over body  . Dizziness and weakness   Immunizations Administered    Name Date Dose VIS Date Route   JANSSEN COVID-19 VACCINE 05/13/2019 11:44 AM 0.5 mL 04/13/2019 Intramuscular   Manufacturer: [employer]   Lot: [id number]   NDC: [phone]

## 2019-05-17 ENCOUNTER — Ambulatory Visit: Payer: Self-pay | Attending: Internal Medicine

## 2020-06-04 ENCOUNTER — Encounter (HOSPITAL_COMMUNITY): Payer: Self-pay

## 2020-06-04 ENCOUNTER — Other Ambulatory Visit: Payer: Self-pay

## 2020-06-04 ENCOUNTER — Emergency Department (HOSPITAL_COMMUNITY)
Admission: EM | Admit: 2020-06-04 | Discharge: 2020-06-04 | Disposition: A | Discharge status: Home / Self Care | Payer: Self-pay | Attending: Emergency Medicine | Admitting: Emergency Medicine

## 2020-06-04 DIAGNOSIS — W540XXA Bitten by dog, initial encounter: Secondary | ICD-10-CM | POA: Insufficient documentation

## 2020-06-04 DIAGNOSIS — S81851A Open bite, right lower leg, initial encounter: Secondary | ICD-10-CM | POA: Insufficient documentation

## 2020-06-04 DIAGNOSIS — Z23 Encounter for immunization: Secondary | ICD-10-CM | POA: Insufficient documentation

## 2020-06-04 DIAGNOSIS — F17200 Nicotine dependence, unspecified, uncomplicated: Secondary | ICD-10-CM | POA: Insufficient documentation

## 2020-06-04 MED ORDER — AMOXICILLIN-POT CLAVULANATE 875-125 MG PO TABS: 1.0000 | ORAL_TABLET | Freq: Two times a day (BID) | ORAL | 0 refills | Status: AC

## 2020-06-04 MED ORDER — TETANUS-DIPHTH-ACELL PERTUSSIS 5-2.5-18.5 LF-MCG/0.5 IM SUSY
0.5000 mL | PREFILLED_SYRINGE | Freq: Once | INTRAMUSCULAR | Status: AC
  Administered 2020-06-04: 0.5 mL via INTRAMUSCULAR
  Filled 2020-06-04: qty 0.5

## 2020-06-04 NOTE — ED Triage Notes (Signed)
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 Abrasion to right calf noted.

## 2020-06-04 NOTE — ED Provider Notes (Signed)
[facility] EMERGENCY DEPARTMENT Provider Note   CSN: [id number] Arrival date & time: 06/04/20  1729     History Chief Complaint  Patient presents with  . Animal Bite    Alexander Soto is a 40 y.o. male.  40 yo male with minor dog bite to posterior right lower leg. Patient called animal control PTA who is verifying the rabies status of the animal, patient is requesting an updated tdap. No other injuries.         History reviewed. No pertinent past medical history.  Patient Active Problem List   Diagnosis Date Noted  . PAIN IN JOINT, MULTIPLE SITES 03/09/2010  . ABDOMINAL TENDERNESS, LEFT LOWER QUADRANT 09/29/2009  . LUMBAR STRAIN 10/30/2008  . CONSTIPATION 08/29/2007  . GASTROENTERITIS 12/06/2006  . ANXIETY 07/12/2006  . GERD 07/12/2006  . 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 EPIGASTRIC PAIN 11/15/2005    History reviewed. No pertinent surgical history.     History reviewed. No pertinent family history.  Social History   Tobacco Use  . Smoking status: Current Every Day Smoker  . Smokeless tobacco: Never Used  Vaping Use  . Vaping Use: Never used  Substance Use Topics  . Alcohol use: Never  . Drug use: Never    Home Medications Prior to Admission medications   Medication Sig Start Date End Date Taking? Authorizing Provider  amoxicillin-clavulanate (AUGMENTIN) 875-125 MG tablet Take 1 tablet by mouth every 12 (twelve) hours for 5 days. 06/04/20 06/09/20 Yes Jeannie Fend, PA-C    Allergies    Patient has no known allergies.  Review of Systems   Review of Systems  Constitutional: Negative for fever.  Musculoskeletal: Negative for arthralgias and myalgias.  Skin: Positive for wound.  Allergic/Immunologic: Negative for immunocompromised state.  Neurological: Negative for weakness and numbness.  Hematological: Negative for adenopathy.    Physical Exam Updated Vital Signs BP (!) 156/104 (BP Location: Right Arm)   Pulse 82   Temp 98.8 F  (37.1 C)   Resp 16   SpO2 97%   Physical Exam Vitals and nursing note reviewed.  Constitutional:      General: He is not in acute distress.    Appearance: He is well-developed. He is not diaphoretic.  HENT:     Head: Normocephalic and atraumatic.  Cardiovascular:     Pulses: Normal pulses.  Pulmonary:     Effort: Pulmonary effort is normal.  Musculoskeletal:        General: Tenderness present. No swelling.     Comments: Mild tenderness with abrasion to right lower leg posteriorly. No puncture.   Skin:    General: Skin is warm and dry.  Neurological:     Mental Status: He is alert and oriented to person, place, and time.     Motor: No weakness.     Gait: Gait normal.  Psychiatric:        Behavior: Behavior normal.     ED Results / Procedures / Treatments   Labs (all labs ordered are listed, but only abnormal results are displayed) Labs Reviewed - No data to display  EKG None  Radiology No results found.  Procedures Procedures   Medications Ordered in ED Medications  Tdap (BOOSTRIX) injection 0.5 mL (has no administration in time range)    ED Course  I have reviewed the triage vital signs and the nursing notes.  Pertinent labs & imaging results that were available during my care of the patient were reviewed by me and considered  in my medical decision making (see chart for details).  Clinical Course as of 06/04/20 1808  Thu Jun 04, 2020  1806 40yo male with minor dog bite to posterior right lower leg. Wound evaluated, minor and does not require closure or imaging. Tdap updated, started on 5 days of Augmentin. Patient understands to follow up with animal control regarding dog's vaccine status and he will need to start rabies series if dog is not located/monitored.  [LM]    Clinical Course User Index [LM] Alden Hipp   MDM Rules/Calculators/A&P                         Final Clinical Impression(s) / ED Diagnoses Final diagnoses:  Dog bite, initial  encounter    Rx / DC Orders ED Discharge Orders         Ordered    amoxicillin-clavulanate (AUGMENTIN) 875-125 MG tablet  Every 12 hours        06/04/20 1753           Jeannie Fend, PA-C 06/04/20 Dorris Singh, MD 06/04/20 [phone]

## 2020-06-04 NOTE — ED Triage Notes (Addendum)
Emergency Medicine Provider Triage Evaluation Note  Alexander Soto , a 40 y.o. male  was evaluated in triage.  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 Last td unknown.  Patient already contacted law enforcement.  Review of Systems  Positive: Leg pain Negative: bleeding  Physical Exam  There were no vitals taken for this visit. Gen:   Awake, no distress   HEENT:  Atraumatic  Resp:  Normal effort  Cardiac:  Normal rate  Abd:   Nondistended, nontender  MSK:   Moves extremities without difficulty, small wound to right lower leg, no active bleeding Neuro:  Speech clear   Medical Decision Making  Medically screening exam initiated at 5:35 PM.  Appropriate orders placed.  Alexander Soto was informed that the remainder of the evaluation will be completed by another provider, this initial triage assessment does not replace that evaluation, and the importance of remaining in the ED until their evaluation is complete.  Clinical Impression     Alexander Fend, PA-C 06/04/20 1739    Alexander Fend, PA-C 06/04/20 1740

## 2020-06-04 NOTE — Discharge Instructions (Addendum)
Recheck with your doctor in 2 days. Take Augmentin as prescribed. Keep wound clean. Follow up with animal control in regards to rabies vaccine status of the dog. You will need to start the rabies vaccine series within 10 days if animal control is unable to located the dog.

## 2020-06-09 ENCOUNTER — Encounter (HOSPITAL_COMMUNITY): Payer: Self-pay
